# Patient Record
Sex: Male | Born: 2000
Health system: Southern US, Community
[De-identification: ages and names within clinical notes are randomized; demographics above are authoritative.]

## PROBLEM LIST (undated history)

## (undated) ENCOUNTER — Ambulatory Visit (HOSPITAL_COMMUNITY): Admission: EM | Payer: 59 | Source: Home / Self Care

---
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
## 2020-01-02 DIAGNOSIS — F419 Anxiety disorder, unspecified: Secondary | ICD-10-CM | POA: Diagnosis not present

## 2020-01-02 DIAGNOSIS — K589 Irritable bowel syndrome without diarrhea: Secondary | ICD-10-CM | POA: Diagnosis not present

## 2020-01-02 DIAGNOSIS — Z2820 Immunization not carried out because of patient decision for unspecified reason: Secondary | ICD-10-CM | POA: Diagnosis not present

## 2020-01-18 DIAGNOSIS — Z03818 Encounter for observation for suspected exposure to other biological agents ruled out: Secondary | ICD-10-CM | POA: Diagnosis not present

## 2020-01-18 DIAGNOSIS — Z20822 Contact with and (suspected) exposure to covid-19: Secondary | ICD-10-CM | POA: Diagnosis not present

## 2020-02-27 DIAGNOSIS — Z113 Encounter for screening for infections with a predominantly sexual mode of transmission: Secondary | ICD-10-CM | POA: Diagnosis not present

## 2020-04-26 ENCOUNTER — Other Ambulatory Visit (HOSPITAL_COMMUNITY): Payer: Self-pay

## 2020-05-09 ENCOUNTER — Other Ambulatory Visit (HOSPITAL_COMMUNITY): Payer: Self-pay

## 2020-05-09 DIAGNOSIS — L551 Sunburn of second degree: Secondary | ICD-10-CM | POA: Diagnosis not present

## 2020-05-09 MED ORDER — NAPROXEN 500 MG PO TABS
ORAL_TABLET | ORAL | 0 refills | Status: DC
  Filled 2020-05-09: qty 10, 5d supply, fill #0

## 2020-05-09 MED ORDER — TRAMADOL HCL 50 MG PO TABS
ORAL_TABLET | ORAL | 0 refills | Status: DC
  Filled 2020-05-09: qty 10, 2d supply, fill #0

## 2020-05-23 ENCOUNTER — Other Ambulatory Visit (HOSPITAL_COMMUNITY): Payer: Self-pay

## 2020-05-23 MED ORDER — BUSPIRONE HCL 5 MG PO TABS
5.0000 mg | ORAL_TABLET | Freq: Three times a day (TID) | ORAL | 1 refills | Status: DC
  Filled 2020-05-23: qty 90, 30d supply, fill #0
  Filled 2020-07-09: qty 90, 30d supply, fill #1

## 2020-05-24 ENCOUNTER — Other Ambulatory Visit (HOSPITAL_COMMUNITY): Payer: Self-pay

## 2020-06-12 IMAGING — CR DG ABDOMEN 2V
2 series · 2 of 2 positions shown · non-contrast
Comparison: Radiograph April 23, 2012.

CLINICAL DATA: Constipation.

EXAM:
ABDOMEN - 2 VIEW

[w abdomen upright]
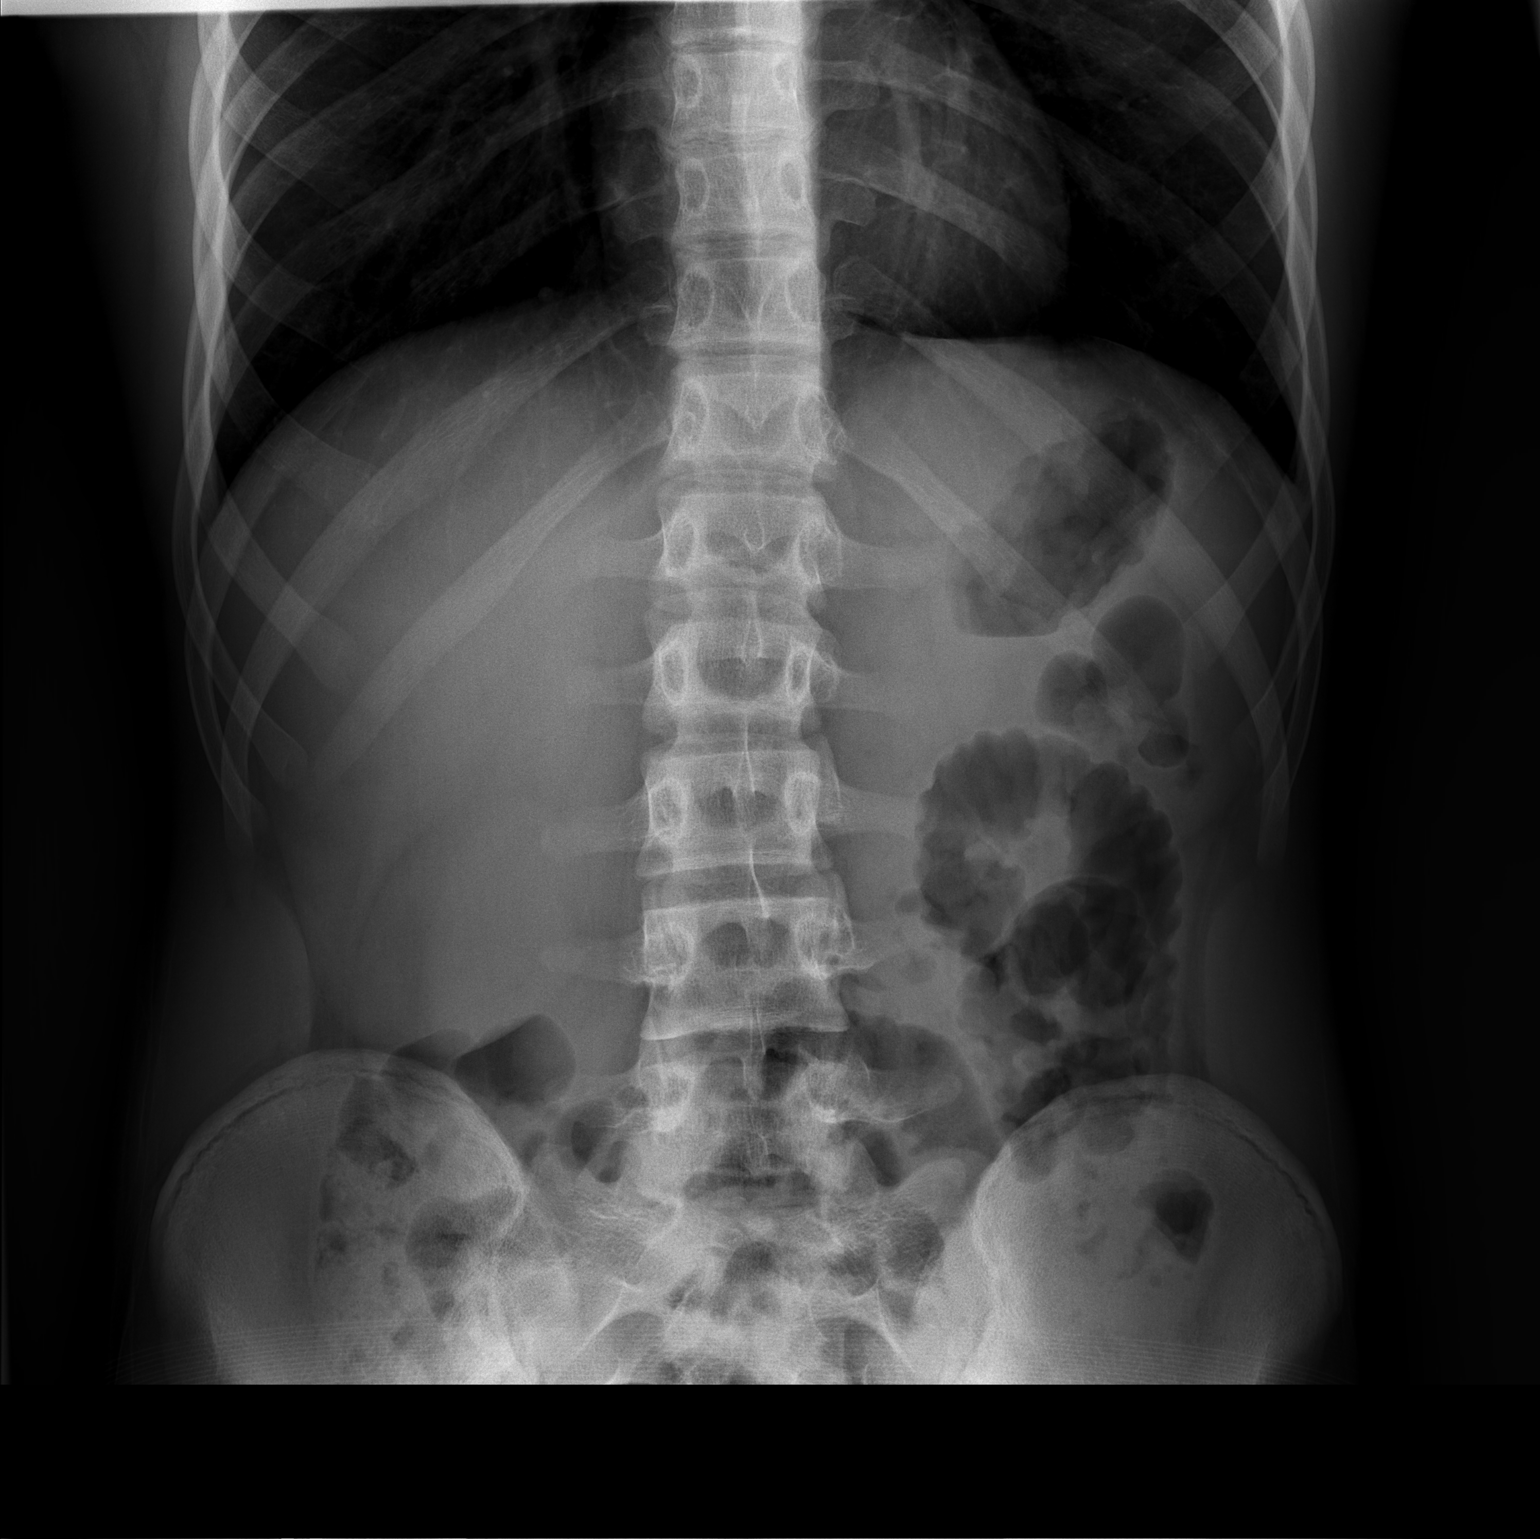

[t abdomen supine]
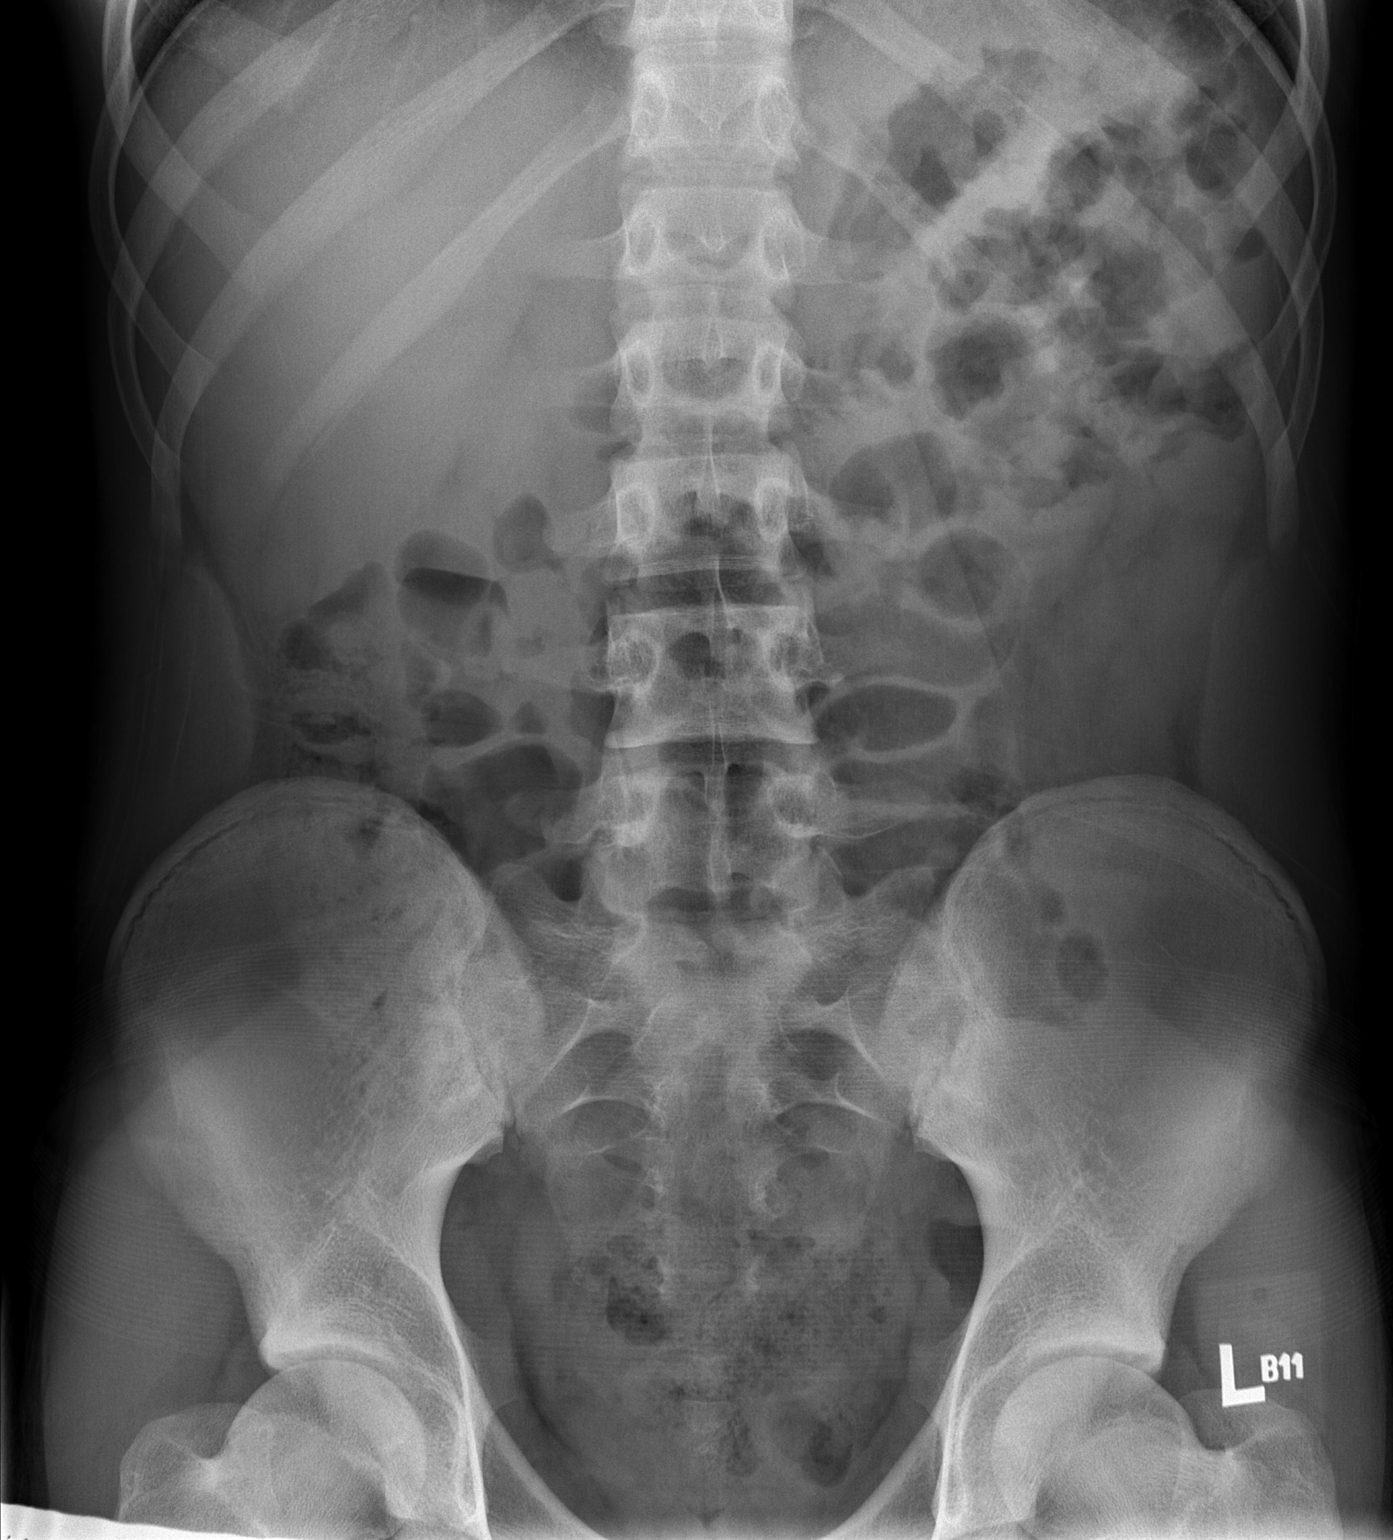

[2 of 2 positions shown; findings below may reference images not displayed]

FINDINGS: The bowel gas pattern is normal. Mild amount of stool is seen
throughout the colon and rectum. There is no evidence of free air.
No radio-opaque calculi or other significant radiographic
abnormality is seen.
IMPRESSION: Mild stool burden is noted. No evidence of bowel obstruction or
ileus.

## 2020-07-09 ENCOUNTER — Other Ambulatory Visit (HOSPITAL_COMMUNITY): Payer: Self-pay

## 2020-07-12 DIAGNOSIS — Z113 Encounter for screening for infections with a predominantly sexual mode of transmission: Secondary | ICD-10-CM | POA: Diagnosis not present

## 2020-08-29 ENCOUNTER — Other Ambulatory Visit (HOSPITAL_COMMUNITY): Payer: Self-pay

## 2020-08-29 MED ORDER — BUSPIRONE HCL 5 MG PO TABS
5.0000 mg | ORAL_TABLET | Freq: Three times a day (TID) | ORAL | 1 refills | Status: DC
  Filled 2020-08-29: qty 90, 30d supply, fill #0

## 2020-11-10 DIAGNOSIS — B084 Enteroviral vesicular stomatitis with exanthem: Secondary | ICD-10-CM | POA: Diagnosis not present

## 2020-11-10 DIAGNOSIS — Z6821 Body mass index (BMI) 21.0-21.9, adult: Secondary | ICD-10-CM | POA: Diagnosis not present

## 2020-11-10 DIAGNOSIS — Z87891 Personal history of nicotine dependence: Secondary | ICD-10-CM | POA: Diagnosis not present

## 2020-11-19 DIAGNOSIS — K219 Gastro-esophageal reflux disease without esophagitis: Secondary | ICD-10-CM | POA: Diagnosis not present

## 2020-12-03 DIAGNOSIS — F411 Generalized anxiety disorder: Secondary | ICD-10-CM | POA: Diagnosis not present

## 2020-12-18 DIAGNOSIS — F411 Generalized anxiety disorder: Secondary | ICD-10-CM | POA: Diagnosis not present

## 2020-12-19 ENCOUNTER — Other Ambulatory Visit (HOSPITAL_COMMUNITY): Payer: Self-pay

## 2020-12-19 MED ORDER — FAMOTIDINE 40 MG PO TABS
40.0000 mg | ORAL_TABLET | Freq: Every evening | ORAL | 5 refills | Status: AC
  Filled 2020-12-19: qty 30, 30d supply, fill #0
  Filled 2021-02-04: qty 30, 30d supply, fill #1

## 2020-12-19 MED ORDER — OMEPRAZOLE 40 MG PO CPDR
40.0000 mg | DELAYED_RELEASE_CAPSULE | Freq: Two times a day (BID) | ORAL | 5 refills | Status: AC
  Filled 2020-12-19: qty 60, 30d supply, fill #0
  Filled 2021-02-04: qty 60, 30d supply, fill #1

## 2021-01-01 ENCOUNTER — Other Ambulatory Visit (HOSPITAL_COMMUNITY): Payer: Self-pay

## 2021-01-01 DIAGNOSIS — F411 Generalized anxiety disorder: Secondary | ICD-10-CM | POA: Diagnosis not present

## 2021-01-01 MED ORDER — PROPRANOLOL HCL 10 MG PO TABS
10.0000 mg | ORAL_TABLET | Freq: Three times a day (TID) | ORAL | 0 refills | Status: AC | PRN
  Filled 2021-01-01 – 2021-01-08 (×2): qty 60, 20d supply, fill #0

## 2021-01-01 MED ORDER — ESCITALOPRAM OXALATE 10 MG PO TABS
10.0000 mg | ORAL_TABLET | Freq: Every day | ORAL | 1 refills | Status: AC
  Filled 2021-01-01 – 2021-01-08 (×2): qty 30, 30d supply, fill #0
  Filled 2021-11-28: qty 30, 30d supply, fill #1

## 2021-01-08 ENCOUNTER — Other Ambulatory Visit: Payer: Self-pay

## 2021-01-08 ENCOUNTER — Other Ambulatory Visit (HOSPITAL_COMMUNITY): Payer: Self-pay

## 2021-01-09 ENCOUNTER — Ambulatory Visit
Admission: EM | Admit: 2021-01-09 | Discharge: 2021-01-09 | Disposition: A | Discharge status: Home / Self Care | Payer: 59 | Attending: Emergency Medicine | Admitting: Emergency Medicine

## 2021-01-09 ENCOUNTER — Other Ambulatory Visit (HOSPITAL_COMMUNITY): Payer: Self-pay

## 2021-01-09 VITALS — BP 143/88 | HR 86 | Temp 98.2°F | Resp 18

## 2021-01-09 DIAGNOSIS — K120 Recurrent oral aphthae: Secondary | ICD-10-CM

## 2021-01-09 MED ORDER — NYSTATIN 100000 UNIT/ML MT SUSP
10.0000 mL | OROMUCOSAL | 1 refills | Status: AC
  Filled 2021-01-09: qty 360, 6d supply, fill #0

## 2021-01-09 NOTE — Discharge Instructions (Addendum)
Please see the enclosed instructions for management and prevention of canker sores.  I provided you with a "Magic mouthwash" that contains several ingredients that should provide you with significant pain relief.  You can swish and spit 15 mL every 3 hours while you are awake.  Thank you for visiting urgent care today, I hope you feel better soon.  If you have not had relief of your symptoms in the next 3 to 5 days, please either follow-up with your primary care provider or with urgent care for further evaluation and treatment.

## 2021-01-09 NOTE — ED Provider Notes (Signed)
[facility]    CSN: [id number] Arrival date & time: 01/09/21  1031    HISTORY  No chief complaint on file.  HPI Mitchell Hunt is a 20 y.o. male. Patient complains of sores in his mouth, inside his lip, under his tongue and on his tongue that appeared about 5 days ago.  Patient denies similar lesions in the past.  Patient denies lesions in his genital area.  Patient denies known exposure to sexually transmitted disease.  Patient denies sick contacts.  Patient states has not tried anything to remedy the sores.  Patient states the sores are very painful, states it hurts to eat or drink anything.  Patient states that this time, the sores have not improved but does not feel they are getting worse.  The history is provided by the patient.  History reviewed. No pertinent past medical history. Patient Active Problem List   Diagnosis Date Noted   Right knee pain 12/27/2015   Traumatic rupture of left plantaris muscle 12/27/2015   History reviewed. No pertinent surgical history.  Home Medications    Prior to Admission medications   Medication Sig Start Date End Date Taking? Authorizing Provider  magic mouthwash (nystatin, lidocaine, diphenhydrAMINE, alum & mag hydroxide) suspension Swish and spit 10 mLs every 3 (three) hours while awake. 01/09/21  Yes Theadora Rama Scales, PA-C  escitalopram (LEXAPRO) 10 MG tablet Take 1 tablet by mouth daily. 01/01/21     famotidine (PEPCID) 40 MG tablet Take 1 tablet by mouth at bedtime. 11/19/20     omeprazole (PRILOSEC) 40 MG capsule Take 1 capsule by mouth 2 times daily. 11/19/20     propranolol (INDERAL) 10 MG tablet Take 1 tablet  by mouth up to 3 times daily as needed for anxiety 01/01/21       Family History Family History  Problem Relation Age of Onset   Hypertension Father    Heart failure Other    Cancer Other        lung   Social History Social History   Tobacco Use   Smoking status: Never   Smokeless tobacco: Never   Substance Use Topics   Alcohol use: No   Drug use: No   Allergies   Patient has no known allergies.  Review of Systems Review of Systems Pertinent findings noted in history of present illness.   Physical Exam Triage Vital Signs ED Triage Vitals  Enc Vitals Group     BP 11/09/20 0827 (!) 147/82     Pulse Rate 11/09/20 0827 72     Resp 11/09/20 0827 18     Temp 11/09/20 0827 98.3 F (36.8 C)     Temp Source 11/09/20 0827 Oral     SpO2 11/09/20 0827 98 %     Weight --      Height --      Head Circumference --      Peak Flow --      Pain Score 11/09/20 0826 5     Pain Loc --      Pain Edu? --      Excl. in GC? --   No data found.  Updated Vital Signs BP (!) 143/88 (BP Location: Right Arm)    Pulse 86    Temp 98.2 F (36.8 C) (Oral)    Resp 18    SpO2 97%   Physical Exam Vitals and nursing note reviewed.  Constitutional:      General: He is not in acute distress.  Appearance: Normal appearance. He is not ill-appearing.  HENT:     Head: Normocephalic and atraumatic.     Salivary Glands: Right salivary gland is not diffusely enlarged or tender. Left salivary gland is not diffusely enlarged or tender.     Right Ear: Tympanic membrane, ear canal and external ear normal. No drainage. No middle ear effusion. There is no impacted cerumen. Tympanic membrane is not erythematous or bulging.     Left Ear: Tympanic membrane, ear canal and external ear normal. No drainage.  No middle ear effusion. There is no impacted cerumen. Tympanic membrane is not erythematous or bulging.     Nose: Nose normal. No nasal deformity, septal deviation, mucosal edema, congestion or rhinorrhea.     Right Turbinates: Not enlarged, swollen or pale.     Left Turbinates: Not enlarged, swollen or pale.     Right Sinus: No maxillary sinus tenderness or frontal sinus tenderness.     Left Sinus: No maxillary sinus tenderness or frontal sinus tenderness.     Mouth/Throat:     Lips: Pink. No lesions.      Mouth: Mucous membranes are moist. No oral lesions.     Dentition: Gingival swelling and gum lesions present.     Tongue: Lesions present.     Pharynx: Oropharynx is clear. Uvula midline. No posterior oropharyngeal erythema or uvula swelling.     Tonsils: No tonsillar exudate. 0 on the right. 0 on the left.     Comments: Aphthous ulcers present on the inner right lip, beneath tongue and on the right side of his tongue Eyes:     General: Lids are normal.        Right eye: No discharge.        Left eye: No discharge.     Extraocular Movements: Extraocular movements intact.     Conjunctiva/sclera: Conjunctivae normal.     Right eye: Right conjunctiva is not injected.     Left eye: Left conjunctiva is not injected.  Neck:     Trachea: Trachea and phonation normal.  Cardiovascular:     Rate and Rhythm: Normal rate and regular rhythm.     Pulses: Normal pulses.     Heart sounds: Normal heart sounds. No murmur heard.   No friction rub. No gallop.  Pulmonary:     Effort: Pulmonary effort is normal. No accessory muscle usage, prolonged expiration or respiratory distress.     Breath sounds: Normal breath sounds. No stridor, decreased air movement or transmitted upper airway sounds. No decreased breath sounds, wheezing, rhonchi or rales.  Chest:     Chest wall: No tenderness.  Musculoskeletal:        General: Normal range of motion.     Cervical back: Normal range of motion and neck supple. Normal range of motion.  Lymphadenopathy:     Cervical: No cervical adenopathy.  Skin:    General: Skin is warm and dry.     Findings: No erythema or rash.  Neurological:     General: No focal deficit present.     Mental Status: He is alert and oriented to person, place, and time.  Psychiatric:        Mood and Affect: Mood normal.        Behavior: Behavior normal.    Visual Acuity Right Eye Distance:   Left Eye Distance:   Bilateral Distance:    Right Eye Near:   Left Eye Near:    Bilateral  Near:  [facility] Couse / Diagnostics / Procedures:    EKG  Radiology No results found.  Procedures Procedures (including critical care time)  UC Diagnoses / Final Clinical Impressions(s)   I have reviewed the triage vital signs and the nursing notes.  Pertinent labs & imaging results that were available during my care of the patient were reviewed by me and considered in my medical decision making (see chart for details).    Final diagnoses:  Aphthous stomatitis   Magic mouthwash, general care measures and education provided for patient.  Return precautions advised.  ED Prescriptions     Medication Sig Dispense Auth. Provider   magic mouthwash (nystatin, lidocaine, diphenhydrAMINE, alum & mag hydroxide) suspension Swish and spit 10 mLs every 3 (three) hours while awake. 360 mL Theadora Rama Scales, PA-C      PDMP not reviewed this encounter.  Pending results:  Labs Reviewed - No data to display  Medications Ordered in UC: Medications - No data to display  Disposition Upon Discharge:  Condition: stable for discharge home Home: take medications as prescribed; routine discharge instructions as discussed; follow up as advised.  Patient presented with an acute illness with associated systemic symptoms and significant discomfort requiring urgent management. In my opinion, this is a condition that a prudent lay person (someone who possesses an average knowledge of health and medicine) may potentially expect to result in complications if not addressed urgently such as respiratory distress, impairment of bodily function or dysfunction of bodily organs.   Routine symptom specific, illness specific and/or disease specific instructions were discussed with the patient and/or caregiver at length.   As such, the patient has been evaluated and assessed, work-up was performed and treatment was provided in alignment with urgent care protocols and evidence based medicine.   Patient/parent/caregiver has been advised that the patient may require follow up for further testing and treatment if the symptoms continue in spite of treatment, as clinically indicated and appropriate.  If the patient was tested for COVID-19, Influenza and/or RSV, then the patient/parent/guardian was advised to isolate at home pending the results of his/her diagnostic coronavirus test and potentially longer if theyre positive. I have also advised pt that if his/her COVID-19 test returns positive, it's recommended to self-isolate for at least 10 days after symptoms first appeared AND until fever-free for 24 hours without fever reducer AND other symptoms have improved or resolved. Discussed self-isolation recommendations as well as instructions for household member/close contacts as per the [facility] and [place] DHHS, and also gave patient the COVID packet with this information.  Patient/parent/caregiver has been advised to return to the [facility] or PCP in 3-5 days if no better; to PCP or the Emergency Department if new signs and symptoms develop, or if the current signs or symptoms continue to change or worsen for further workup, evaluation and treatment as clinically indicated and appropriate  The patient will follow up with their current PCP if and as advised. If the patient does not currently have a PCP we will assist them in obtaining one.   The patient may need specialty follow up if the symptoms continue, in spite of conservative treatment and management, for further workup, evaluation, consultation and treatment as clinically indicated and appropriate.   Patient/parent/caregiver verbalized understanding and agreement of plan as discussed.  All questions were addressed during visit.  Please see discharge instructions below for further details of plan.  Discharge Instructions:   Discharge Instructions      Please see the enclosed instructions for  management and prevention of canker sores.  I provided you  with a "Magic mouthwash" that contains several ingredients that should provide you with significant pain relief.  You can swish and spit 15 mL every 3 hours while you are awake.  Thank you for visiting urgent care today, I hope you feel better soon.  If you have not had relief of your symptoms in the next 3 to 5 days, please either follow-up with your primary care provider or with urgent care for further evaluation and treatment.    This office note has been dictated using Teaching laboratory technician.  Unfortunately, and despite my best efforts, this method of dictation can sometimes lead to occasional typographical or grammatical errors.  I apologize in advance if this occurs.     Theadora Rama Scales, PA-C 01/09/21 1102

## 2021-01-09 NOTE — ED Triage Notes (Signed)
Pt c/o sores to his tongue, lip and throat that began 5 days ago.

## 2021-01-10 ENCOUNTER — Other Ambulatory Visit (HOSPITAL_COMMUNITY): Payer: Self-pay

## 2021-01-11 ENCOUNTER — Other Ambulatory Visit (HOSPITAL_COMMUNITY): Payer: Self-pay

## 2021-01-31 ENCOUNTER — Other Ambulatory Visit (HOSPITAL_COMMUNITY): Payer: Self-pay

## 2021-01-31 DIAGNOSIS — F411 Generalized anxiety disorder: Secondary | ICD-10-CM | POA: Diagnosis not present

## 2021-01-31 MED ORDER — PROPRANOLOL HCL 10 MG PO TABS
ORAL_TABLET | ORAL | 0 refills | Status: DC
  Filled 2021-01-31: qty 60, 20d supply, fill #0

## 2021-01-31 MED ORDER — ESCITALOPRAM OXALATE 10 MG PO TABS
15.0000 mg | ORAL_TABLET | Freq: Every day | ORAL | 0 refills | Status: AC
  Filled 2021-01-31: qty 45, 30d supply, fill #0

## 2021-02-01 ENCOUNTER — Other Ambulatory Visit (HOSPITAL_COMMUNITY): Payer: Self-pay

## 2021-02-04 ENCOUNTER — Other Ambulatory Visit (HOSPITAL_COMMUNITY): Payer: Self-pay

## 2021-02-14 ENCOUNTER — Other Ambulatory Visit (HOSPITAL_COMMUNITY): Payer: Self-pay

## 2021-02-14 DIAGNOSIS — F411 Generalized anxiety disorder: Secondary | ICD-10-CM | POA: Diagnosis not present

## 2021-02-14 MED ORDER — SERTRALINE HCL 25 MG PO TABS
ORAL_TABLET | ORAL | 1 refills | Status: AC
  Filled 2021-02-14: qty 45, 30d supply, fill #0

## 2021-03-04 DIAGNOSIS — R519 Headache, unspecified: Secondary | ICD-10-CM | POA: Diagnosis not present

## 2021-03-04 DIAGNOSIS — S060X0A Concussion without loss of consciousness, initial encounter: Secondary | ICD-10-CM | POA: Diagnosis not present

## 2021-03-04 DIAGNOSIS — S01511A Laceration without foreign body of lip, initial encounter: Secondary | ICD-10-CM | POA: Diagnosis not present

## 2021-03-04 DIAGNOSIS — K120 Recurrent oral aphthae: Secondary | ICD-10-CM | POA: Diagnosis not present

## 2021-03-11 ENCOUNTER — Other Ambulatory Visit (HOSPITAL_COMMUNITY): Payer: Self-pay

## 2021-03-11 DIAGNOSIS — K219 Gastro-esophageal reflux disease without esophagitis: Secondary | ICD-10-CM | POA: Diagnosis not present

## 2021-03-11 MED ORDER — SERTRALINE HCL 50 MG PO TABS
50.0000 mg | ORAL_TABLET | Freq: Every day | ORAL | 0 refills | Status: DC
  Filled 2021-03-11: qty 30, 30d supply, fill #0

## 2021-03-11 MED ORDER — PROPRANOLOL HCL 10 MG PO TABS
ORAL_TABLET | ORAL | 0 refills | Status: DC
  Filled 2021-03-11: qty 60, 20d supply, fill #0

## 2021-03-14 DIAGNOSIS — F411 Generalized anxiety disorder: Secondary | ICD-10-CM | POA: Diagnosis not present

## 2021-04-08 DIAGNOSIS — F411 Generalized anxiety disorder: Secondary | ICD-10-CM | POA: Diagnosis not present

## 2021-04-15 ENCOUNTER — Other Ambulatory Visit (HOSPITAL_COMMUNITY): Payer: Self-pay

## 2021-04-15 MED ORDER — SERTRALINE HCL 50 MG PO TABS
ORAL_TABLET | ORAL | 2 refills | Status: AC
  Filled 2021-04-15: qty 30, 30d supply, fill #0

## 2021-04-15 MED ORDER — PROPRANOLOL HCL 10 MG PO TABS
ORAL_TABLET | ORAL | 2 refills | Status: AC
  Filled 2021-04-15: qty 60, 30d supply, fill #0
  Filled 2021-06-05: qty 60, 30d supply, fill #1

## 2021-04-16 ENCOUNTER — Other Ambulatory Visit (HOSPITAL_COMMUNITY): Payer: Self-pay

## 2021-04-18 ENCOUNTER — Other Ambulatory Visit (HOSPITAL_COMMUNITY): Payer: Self-pay

## 2021-04-18 DIAGNOSIS — F411 Generalized anxiety disorder: Secondary | ICD-10-CM | POA: Diagnosis not present

## 2021-04-18 MED ORDER — SERTRALINE HCL 100 MG PO TABS
100.0000 mg | ORAL_TABLET | Freq: Every day | ORAL | 0 refills | Status: DC
  Filled 2021-04-18: qty 30, 30d supply, fill #0

## 2021-05-10 DIAGNOSIS — F411 Generalized anxiety disorder: Secondary | ICD-10-CM | POA: Diagnosis not present

## 2021-05-17 ENCOUNTER — Other Ambulatory Visit (HOSPITAL_COMMUNITY): Payer: Self-pay

## 2021-05-17 DIAGNOSIS — F411 Generalized anxiety disorder: Secondary | ICD-10-CM | POA: Diagnosis not present

## 2021-05-17 MED ORDER — SERTRALINE HCL 100 MG PO TABS
100.0000 mg | ORAL_TABLET | Freq: Every day | ORAL | 0 refills | Status: AC
  Filled 2021-05-17: qty 30, 30d supply, fill #0

## 2021-06-05 ENCOUNTER — Other Ambulatory Visit (HOSPITAL_COMMUNITY): Payer: Self-pay

## 2021-06-07 ENCOUNTER — Other Ambulatory Visit (HOSPITAL_COMMUNITY): Payer: Self-pay

## 2021-06-07 DIAGNOSIS — F411 Generalized anxiety disorder: Secondary | ICD-10-CM | POA: Diagnosis not present

## 2021-06-07 MED ORDER — FLUVOXAMINE MALEATE 50 MG PO TABS
ORAL_TABLET | ORAL | 0 refills | Status: AC
  Filled 2021-06-07: qty 45, 30d supply, fill #0

## 2021-06-07 MED ORDER — SERTRALINE HCL 50 MG PO TABS
ORAL_TABLET | ORAL | 0 refills | Status: AC
  Filled 2021-06-07: qty 7, 7d supply, fill #0

## 2021-07-03 ENCOUNTER — Other Ambulatory Visit (HOSPITAL_COMMUNITY): Payer: Self-pay

## 2021-07-03 MED ORDER — PROPRANOLOL HCL 10 MG PO TABS
ORAL_TABLET | ORAL | 2 refills | Status: AC
  Filled 2021-07-03: qty 60, 20d supply, fill #0
  Filled 2021-08-26: qty 60, 20d supply, fill #1

## 2021-07-03 MED ORDER — FLUVOXAMINE MALEATE 100 MG PO TABS
ORAL_TABLET | ORAL | 1 refills | Status: AC
  Filled 2021-07-03: qty 30, 30d supply, fill #0

## 2021-07-26 ENCOUNTER — Other Ambulatory Visit (HOSPITAL_COMMUNITY): Payer: Self-pay

## 2021-07-26 DIAGNOSIS — F411 Generalized anxiety disorder: Secondary | ICD-10-CM | POA: Diagnosis not present

## 2021-07-26 MED ORDER — ESCITALOPRAM OXALATE 10 MG PO TABS
10.0000 mg | ORAL_TABLET | Freq: Every day | ORAL | 0 refills | Status: AC
  Filled 2021-07-26: qty 30, 30d supply, fill #0

## 2021-08-01 DIAGNOSIS — H5213 Myopia, bilateral: Secondary | ICD-10-CM | POA: Diagnosis not present

## 2021-08-23 ENCOUNTER — Other Ambulatory Visit (HOSPITAL_COMMUNITY): Payer: Self-pay

## 2021-08-23 DIAGNOSIS — F411 Generalized anxiety disorder: Secondary | ICD-10-CM | POA: Diagnosis not present

## 2021-08-23 MED ORDER — ESCITALOPRAM OXALATE 20 MG PO TABS
20.0000 mg | ORAL_TABLET | Freq: Every day | ORAL | 0 refills | Status: DC
  Filled 2021-08-23: qty 30, 30d supply, fill #0

## 2021-08-26 ENCOUNTER — Other Ambulatory Visit (HOSPITAL_COMMUNITY): Payer: Self-pay

## 2021-09-20 ENCOUNTER — Other Ambulatory Visit (HOSPITAL_COMMUNITY): Payer: Self-pay

## 2021-09-20 DIAGNOSIS — F411 Generalized anxiety disorder: Secondary | ICD-10-CM | POA: Diagnosis not present

## 2021-09-20 MED ORDER — PROPRANOLOL HCL 10 MG PO TABS
ORAL_TABLET | ORAL | 2 refills | Status: AC
  Filled 2021-09-20: qty 60, 20d supply, fill #0

## 2021-09-20 MED ORDER — ESCITALOPRAM OXALATE 20 MG PO TABS
20.0000 mg | ORAL_TABLET | Freq: Every day | ORAL | 0 refills | Status: DC
  Filled 2021-09-20: qty 30, 30d supply, fill #0

## 2021-10-22 ENCOUNTER — Other Ambulatory Visit (HOSPITAL_COMMUNITY): Payer: Self-pay

## 2021-10-22 DIAGNOSIS — F411 Generalized anxiety disorder: Secondary | ICD-10-CM | POA: Diagnosis not present

## 2021-10-22 MED ORDER — ESCITALOPRAM OXALATE 10 MG PO TABS
10.0000 mg | ORAL_TABLET | Freq: Every day | ORAL | 0 refills | Status: AC
  Filled 2021-10-22: qty 30, 30d supply, fill #0

## 2021-10-22 MED ORDER — AMITRIPTYLINE HCL 25 MG PO TABS
25.0000 mg | ORAL_TABLET | Freq: Every day | ORAL | 0 refills | Status: DC
  Filled 2021-10-22: qty 30, 30d supply, fill #0

## 2021-11-12 ENCOUNTER — Other Ambulatory Visit (HOSPITAL_COMMUNITY): Payer: Self-pay

## 2021-11-12 DIAGNOSIS — F408 Other phobic anxiety disorders: Secondary | ICD-10-CM | POA: Diagnosis not present

## 2021-11-12 DIAGNOSIS — F450 Somatization disorder: Secondary | ICD-10-CM | POA: Diagnosis not present

## 2021-11-12 DIAGNOSIS — F411 Generalized anxiety disorder: Secondary | ICD-10-CM | POA: Diagnosis not present

## 2021-11-12 MED ORDER — ESCITALOPRAM OXALATE 10 MG PO TABS
10.0000 mg | ORAL_TABLET | Freq: Every day | ORAL | 0 refills | Status: DC
  Filled 2021-11-12 – 2021-11-19 (×2): qty 30, 30d supply, fill #0

## 2021-11-12 MED ORDER — AMITRIPTYLINE HCL 25 MG PO TABS
25.0000 mg | ORAL_TABLET | Freq: Every day | ORAL | 0 refills | Status: DC
  Filled 2021-11-12 – 2021-11-19 (×2): qty 30, 30d supply, fill #0

## 2021-11-19 ENCOUNTER — Other Ambulatory Visit (HOSPITAL_COMMUNITY): Payer: Self-pay

## 2021-11-21 ENCOUNTER — Other Ambulatory Visit (HOSPITAL_COMMUNITY): Payer: Self-pay

## 2021-11-23 ENCOUNTER — Other Ambulatory Visit (HOSPITAL_COMMUNITY): Payer: Self-pay

## 2021-11-25 ENCOUNTER — Other Ambulatory Visit: Payer: Self-pay

## 2021-11-25 ENCOUNTER — Other Ambulatory Visit (HOSPITAL_COMMUNITY): Payer: Self-pay

## 2021-11-26 ENCOUNTER — Other Ambulatory Visit (HOSPITAL_COMMUNITY): Payer: Self-pay

## 2021-11-28 ENCOUNTER — Other Ambulatory Visit (HOSPITAL_COMMUNITY): Payer: Self-pay

## 2021-12-06 ENCOUNTER — Other Ambulatory Visit (HOSPITAL_COMMUNITY): Payer: Self-pay

## 2021-12-13 DIAGNOSIS — F411 Generalized anxiety disorder: Secondary | ICD-10-CM | POA: Diagnosis not present

## 2022-01-14 ENCOUNTER — Other Ambulatory Visit (HOSPITAL_COMMUNITY): Payer: Self-pay

## 2022-01-14 ENCOUNTER — Other Ambulatory Visit: Payer: Self-pay

## 2022-01-14 DIAGNOSIS — F411 Generalized anxiety disorder: Secondary | ICD-10-CM | POA: Diagnosis not present

## 2022-01-14 MED ORDER — AMITRIPTYLINE HCL 25 MG PO TABS
25.0000 mg | ORAL_TABLET | Freq: Every day | ORAL | 1 refills | Status: DC
  Filled 2022-01-14: qty 30, 30d supply, fill #0
  Filled 2022-02-18: qty 30, 30d supply, fill #1

## 2022-01-14 MED ORDER — ESCITALOPRAM OXALATE 10 MG PO TABS
10.0000 mg | ORAL_TABLET | Freq: Every day | ORAL | 1 refills | Status: DC
  Filled 2022-01-14: qty 30, 30d supply, fill #0
  Filled 2022-02-18: qty 30, 30d supply, fill #1

## 2022-01-16 ENCOUNTER — Other Ambulatory Visit (HOSPITAL_COMMUNITY): Payer: Self-pay

## 2022-01-20 ENCOUNTER — Other Ambulatory Visit (HOSPITAL_COMMUNITY): Payer: Self-pay

## 2022-01-21 ENCOUNTER — Other Ambulatory Visit (HOSPITAL_COMMUNITY): Payer: Self-pay

## 2022-02-18 ENCOUNTER — Other Ambulatory Visit (HOSPITAL_COMMUNITY): Payer: Self-pay

## 2022-02-19 ENCOUNTER — Other Ambulatory Visit: Payer: Self-pay

## 2022-02-20 ENCOUNTER — Other Ambulatory Visit (HOSPITAL_COMMUNITY): Payer: Self-pay

## 2022-03-04 ENCOUNTER — Other Ambulatory Visit (HOSPITAL_COMMUNITY): Payer: Self-pay

## 2022-03-04 DIAGNOSIS — F411 Generalized anxiety disorder: Secondary | ICD-10-CM | POA: Diagnosis not present

## 2022-03-04 MED ORDER — ESCITALOPRAM OXALATE 10 MG PO TABS
10.0000 mg | ORAL_TABLET | Freq: Every day | ORAL | 1 refills | Status: DC
  Filled 2022-03-04 – 2022-03-22 (×2): qty 30, 30d supply, fill #0
  Filled 2022-04-21: qty 30, 30d supply, fill #1

## 2022-03-04 MED ORDER — AMITRIPTYLINE HCL 25 MG PO TABS
25.0000 mg | ORAL_TABLET | Freq: Every day | ORAL | 1 refills | Status: DC
  Filled 2022-03-04 – 2022-03-22 (×2): qty 30, 30d supply, fill #0
  Filled 2022-04-21: qty 30, 30d supply, fill #1

## 2022-03-22 ENCOUNTER — Other Ambulatory Visit (HOSPITAL_COMMUNITY): Payer: Self-pay

## 2022-03-25 ENCOUNTER — Other Ambulatory Visit (HOSPITAL_COMMUNITY): Payer: Self-pay

## 2022-03-25 ENCOUNTER — Other Ambulatory Visit: Payer: Self-pay

## 2022-03-26 ENCOUNTER — Other Ambulatory Visit (HOSPITAL_COMMUNITY): Payer: Self-pay

## 2022-04-21 ENCOUNTER — Other Ambulatory Visit (HOSPITAL_COMMUNITY): Payer: Self-pay

## 2022-04-29 ENCOUNTER — Other Ambulatory Visit (HOSPITAL_COMMUNITY): Payer: Self-pay

## 2022-04-29 DIAGNOSIS — F411 Generalized anxiety disorder: Secondary | ICD-10-CM | POA: Diagnosis not present

## 2022-04-29 MED ORDER — ESCITALOPRAM OXALATE 10 MG PO TABS
10.0000 mg | ORAL_TABLET | Freq: Every day | ORAL | 2 refills | Status: AC
  Filled 2022-05-14: qty 30, 30d supply, fill #0
  Filled 2022-06-19 (×2): qty 30, 30d supply, fill #1

## 2022-04-29 MED ORDER — AMITRIPTYLINE HCL 25 MG PO TABS
25.0000 mg | ORAL_TABLET | Freq: Every day | ORAL | 2 refills | Status: DC
  Filled 2022-05-14: qty 30, 30d supply, fill #0
  Filled 2022-06-19 (×2): qty 30, 30d supply, fill #1
  Filled 2022-07-22: qty 30, 30d supply, fill #2

## 2022-05-14 ENCOUNTER — Other Ambulatory Visit (HOSPITAL_COMMUNITY): Payer: Self-pay

## 2022-05-14 DIAGNOSIS — Z711 Person with feared health complaint in whom no diagnosis is made: Secondary | ICD-10-CM | POA: Diagnosis not present

## 2022-05-15 ENCOUNTER — Other Ambulatory Visit: Payer: Self-pay

## 2022-06-19 ENCOUNTER — Other Ambulatory Visit (HOSPITAL_COMMUNITY): Payer: Self-pay

## 2022-06-20 ENCOUNTER — Other Ambulatory Visit (HOSPITAL_COMMUNITY): Payer: Self-pay

## 2022-06-20 ENCOUNTER — Other Ambulatory Visit: Payer: Self-pay

## 2022-06-30 DIAGNOSIS — F411 Generalized anxiety disorder: Secondary | ICD-10-CM | POA: Diagnosis not present

## 2022-07-01 ENCOUNTER — Other Ambulatory Visit (HOSPITAL_COMMUNITY): Payer: Self-pay

## 2022-07-01 MED ORDER — ESCITALOPRAM OXALATE 10 MG PO TABS
10.0000 mg | ORAL_TABLET | Freq: Every day | ORAL | 2 refills | Status: AC
  Filled 2022-07-22: qty 30, 30d supply, fill #0

## 2022-07-01 MED ORDER — AMITRIPTYLINE HCL 25 MG PO TABS: 25.0000 mg | ORAL_TABLET | Freq: Every day | ORAL | 2 refills | Status: AC

## 2022-07-22 ENCOUNTER — Other Ambulatory Visit (HOSPITAL_COMMUNITY): Payer: Self-pay

## 2022-08-12 DIAGNOSIS — F411 Generalized anxiety disorder: Secondary | ICD-10-CM | POA: Diagnosis not present

## 2022-08-22 ENCOUNTER — Other Ambulatory Visit (HOSPITAL_COMMUNITY): Payer: Self-pay

## 2022-08-22 ENCOUNTER — Other Ambulatory Visit: Payer: Self-pay

## 2022-09-22 DIAGNOSIS — F408 Other phobic anxiety disorders: Secondary | ICD-10-CM | POA: Diagnosis not present

## 2022-09-23 ENCOUNTER — Other Ambulatory Visit (HOSPITAL_COMMUNITY): Payer: Self-pay

## 2022-09-23 MED ORDER — AMITRIPTYLINE HCL 25 MG PO TABS
25.0000 mg | ORAL_TABLET | Freq: Every day | ORAL | 2 refills | Status: AC
  Filled 2022-09-23: qty 30, 30d supply, fill #0
  Filled 2022-10-23: qty 30, 30d supply, fill #1
  Filled 2022-11-19: qty 30, 30d supply, fill #2

## 2022-09-23 MED ORDER — ESCITALOPRAM OXALATE 10 MG PO TABS
10.0000 mg | ORAL_TABLET | Freq: Every day | ORAL | 2 refills | Status: AC
  Filled 2022-09-23: qty 30, 30d supply, fill #0
  Filled 2022-10-23: qty 30, 30d supply, fill #1
  Filled 2022-11-21: qty 30, 30d supply, fill #2

## 2022-10-23 ENCOUNTER — Other Ambulatory Visit (HOSPITAL_COMMUNITY): Payer: Self-pay

## 2022-10-23 ENCOUNTER — Other Ambulatory Visit: Payer: Self-pay

## 2022-11-10 DIAGNOSIS — F411 Generalized anxiety disorder: Secondary | ICD-10-CM | POA: Diagnosis not present

## 2022-11-19 ENCOUNTER — Other Ambulatory Visit (HOSPITAL_COMMUNITY): Payer: Self-pay

## 2022-11-19 ENCOUNTER — Other Ambulatory Visit: Payer: Self-pay

## 2022-11-21 ENCOUNTER — Other Ambulatory Visit (HOSPITAL_COMMUNITY): Payer: Self-pay

## 2022-11-21 MED ORDER — ESCITALOPRAM OXALATE 20 MG PO TABS
20.0000 mg | ORAL_TABLET | Freq: Every day | ORAL | 1 refills | Status: DC
  Filled 2022-11-21: qty 30, 30d supply, fill #0
  Filled 2022-12-17: qty 30, 30d supply, fill #1

## 2022-11-24 ENCOUNTER — Other Ambulatory Visit: Payer: Self-pay

## 2022-11-27 ENCOUNTER — Other Ambulatory Visit: Payer: Self-pay

## 2022-12-01 DIAGNOSIS — F411 Generalized anxiety disorder: Secondary | ICD-10-CM | POA: Diagnosis not present

## 2022-12-02 ENCOUNTER — Other Ambulatory Visit (HOSPITAL_COMMUNITY): Payer: Self-pay

## 2022-12-02 MED ORDER — AMITRIPTYLINE HCL 50 MG PO TABS
50.0000 mg | ORAL_TABLET | Freq: Every day | ORAL | 2 refills | Status: AC
  Filled 2022-12-02: qty 30, 30d supply, fill #0
  Filled 2023-01-20: qty 30, 30d supply, fill #1
  Filled 2023-06-12: qty 30, 30d supply, fill #2

## 2022-12-04 DIAGNOSIS — H5213 Myopia, bilateral: Secondary | ICD-10-CM | POA: Diagnosis not present

## 2022-12-17 ENCOUNTER — Other Ambulatory Visit: Payer: Self-pay

## 2022-12-17 ENCOUNTER — Other Ambulatory Visit (HOSPITAL_COMMUNITY): Payer: Self-pay

## 2022-12-22 ENCOUNTER — Other Ambulatory Visit (HOSPITAL_COMMUNITY): Payer: Self-pay

## 2022-12-22 DIAGNOSIS — F411 Generalized anxiety disorder: Secondary | ICD-10-CM | POA: Diagnosis not present

## 2022-12-22 MED ORDER — ESCITALOPRAM OXALATE 20 MG PO TABS
20.0000 mg | ORAL_TABLET | Freq: Every day | ORAL | 1 refills | Status: AC
  Filled 2023-01-20: qty 30, 30d supply, fill #0

## 2022-12-23 ENCOUNTER — Other Ambulatory Visit (HOSPITAL_COMMUNITY): Payer: Self-pay

## 2023-01-20 ENCOUNTER — Other Ambulatory Visit (HOSPITAL_COMMUNITY): Payer: Self-pay

## 2023-01-20 ENCOUNTER — Other Ambulatory Visit: Payer: Self-pay

## 2023-01-26 DIAGNOSIS — F411 Generalized anxiety disorder: Secondary | ICD-10-CM | POA: Diagnosis not present

## 2023-01-27 ENCOUNTER — Other Ambulatory Visit (HOSPITAL_COMMUNITY): Payer: Self-pay

## 2023-01-27 MED ORDER — AMITRIPTYLINE HCL 50 MG PO TABS
50.0000 mg | ORAL_TABLET | Freq: Every day | ORAL | 2 refills | Status: DC
  Filled 2023-01-27 – 2023-02-17 (×2): qty 30, 30d supply, fill #0
  Filled 2023-03-13: qty 30, 30d supply, fill #1

## 2023-01-27 MED ORDER — ESCITALOPRAM OXALATE 20 MG PO TABS
20.0000 mg | ORAL_TABLET | Freq: Every day | ORAL | 1 refills | Status: AC
  Filled 2023-01-27 – 2023-02-17 (×2): qty 30, 30d supply, fill #0
  Filled 2023-03-13: qty 30, 30d supply, fill #1

## 2023-02-17 ENCOUNTER — Other Ambulatory Visit (HOSPITAL_COMMUNITY): Payer: Self-pay

## 2023-02-23 ENCOUNTER — Other Ambulatory Visit (HOSPITAL_COMMUNITY): Payer: Self-pay

## 2023-03-04 ENCOUNTER — Other Ambulatory Visit (HOSPITAL_COMMUNITY): Payer: Self-pay

## 2023-03-13 ENCOUNTER — Other Ambulatory Visit (HOSPITAL_COMMUNITY): Payer: Self-pay

## 2023-03-16 ENCOUNTER — Other Ambulatory Visit (HOSPITAL_COMMUNITY): Payer: Self-pay

## 2023-03-16 MED ORDER — ESCITALOPRAM OXALATE 20 MG PO TABS
20.0000 mg | ORAL_TABLET | Freq: Every day | ORAL | 1 refills | Status: AC
  Filled 2023-04-13: qty 30, 30d supply, fill #0

## 2023-03-16 MED ORDER — ARIPIPRAZOLE 2 MG PO TABS
2.0000 mg | ORAL_TABLET | Freq: Every evening | ORAL | 0 refills | Status: DC
Start: 2023-03-16 — End: 2023-04-13
  Filled 2023-03-16: qty 30, 30d supply, fill #0

## 2023-03-16 MED ORDER — AMITRIPTYLINE HCL 25 MG PO TABS
25.0000 mg | ORAL_TABLET | Freq: Every day | ORAL | 0 refills | Status: DC
  Filled 2023-03-16: qty 30, 30d supply, fill #0

## 2023-03-17 ENCOUNTER — Other Ambulatory Visit: Payer: Self-pay

## 2023-04-13 ENCOUNTER — Other Ambulatory Visit (HOSPITAL_BASED_OUTPATIENT_CLINIC_OR_DEPARTMENT_OTHER): Payer: Self-pay

## 2023-04-13 ENCOUNTER — Other Ambulatory Visit (HOSPITAL_COMMUNITY): Payer: Self-pay

## 2023-04-13 ENCOUNTER — Other Ambulatory Visit: Payer: Self-pay

## 2023-04-13 MED ORDER — ESCITALOPRAM OXALATE 20 MG PO TABS
20.0000 mg | ORAL_TABLET | Freq: Every day | ORAL | 1 refills | Status: AC
  Filled 2023-04-13: qty 30, 30d supply, fill #0

## 2023-04-13 MED ORDER — AMITRIPTYLINE HCL 25 MG PO TABS
25.0000 mg | ORAL_TABLET | Freq: Every day | ORAL | 0 refills | Status: AC
  Filled 2023-04-13: qty 30, 30d supply, fill #0

## 2023-04-13 MED ORDER — ARIPIPRAZOLE 2 MG PO TABS
2.0000 mg | ORAL_TABLET | Freq: Every evening | ORAL | 0 refills | Status: AC
Start: 2023-04-13 — End: ?
  Filled 2023-04-13: qty 30, 30d supply, fill #0

## 2023-04-14 ENCOUNTER — Other Ambulatory Visit (HOSPITAL_COMMUNITY): Payer: Self-pay

## 2023-04-14 ENCOUNTER — Other Ambulatory Visit: Payer: Self-pay

## 2023-04-15 ENCOUNTER — Other Ambulatory Visit (HOSPITAL_COMMUNITY): Payer: Self-pay

## 2023-04-30 ENCOUNTER — Other Ambulatory Visit: Payer: Self-pay

## 2023-04-30 ENCOUNTER — Other Ambulatory Visit (HOSPITAL_COMMUNITY): Payer: Self-pay

## 2023-04-30 MED ORDER — PROPRANOLOL HCL 10 MG PO TABS
10.0000 mg | ORAL_TABLET | ORAL | 0 refills | Status: AC | PRN
  Filled 2023-04-30 – 2023-05-13 (×2): qty 60, 30d supply, fill #0

## 2023-04-30 MED ORDER — AMITRIPTYLINE HCL 50 MG PO TABS
50.0000 mg | ORAL_TABLET | Freq: Every day | ORAL | 0 refills | Status: AC
  Filled 2023-04-30 – 2023-05-14 (×2): qty 30, 30d supply, fill #0

## 2023-04-30 MED ORDER — ESCITALOPRAM OXALATE 20 MG PO TABS
30.0000 mg | ORAL_TABLET | Freq: Every day | ORAL | 1 refills | Status: AC
  Filled 2023-04-30 – 2023-05-13 (×2): qty 45, 30d supply, fill #0
  Filled 2023-07-16: qty 45, 30d supply, fill #1

## 2023-05-01 ENCOUNTER — Other Ambulatory Visit: Payer: Self-pay

## 2023-05-06 ENCOUNTER — Other Ambulatory Visit: Payer: Self-pay

## 2023-05-13 ENCOUNTER — Other Ambulatory Visit: Payer: Self-pay

## 2023-05-13 ENCOUNTER — Other Ambulatory Visit (HOSPITAL_COMMUNITY): Payer: Self-pay

## 2023-05-14 ENCOUNTER — Other Ambulatory Visit (HOSPITAL_COMMUNITY): Payer: Self-pay

## 2023-05-21 ENCOUNTER — Other Ambulatory Visit (HOSPITAL_COMMUNITY): Payer: Self-pay

## 2023-05-21 MED ORDER — ESCITALOPRAM OXALATE 20 MG PO TABS
30.0000 mg | ORAL_TABLET | Freq: Every day | ORAL | 1 refills | Status: AC
  Filled 2023-05-21 – 2023-06-12 (×2): qty 45, 30d supply, fill #0

## 2023-05-21 MED ORDER — AMITRIPTYLINE HCL 50 MG PO TABS
50.0000 mg | ORAL_TABLET | Freq: Every day | ORAL | 0 refills | Status: AC
  Filled 2023-05-21: qty 30, 30d supply, fill #0

## 2023-06-12 ENCOUNTER — Other Ambulatory Visit (HOSPITAL_COMMUNITY): Payer: Self-pay

## 2023-06-15 ENCOUNTER — Other Ambulatory Visit (HOSPITAL_COMMUNITY): Payer: Self-pay

## 2023-06-18 ENCOUNTER — Other Ambulatory Visit (HOSPITAL_COMMUNITY): Payer: Self-pay

## 2023-06-18 MED ORDER — AMITRIPTYLINE HCL 50 MG PO TABS
50.0000 mg | ORAL_TABLET | Freq: Every day | ORAL | 1 refills | Status: AC
  Filled 2023-07-16: qty 30, 30d supply, fill #0

## 2023-06-18 MED ORDER — ESCITALOPRAM OXALATE 20 MG PO TABS: 30.0000 mg | ORAL_TABLET | Freq: Every day | ORAL | 1 refills | Status: AC

## 2023-07-16 ENCOUNTER — Other Ambulatory Visit: Payer: Self-pay

## 2023-07-16 ENCOUNTER — Other Ambulatory Visit (HOSPITAL_COMMUNITY): Payer: Self-pay

## 2023-08-03 ENCOUNTER — Other Ambulatory Visit (HOSPITAL_COMMUNITY): Payer: Self-pay

## 2023-08-03 MED ORDER — AMITRIPTYLINE HCL 50 MG PO TABS
50.0000 mg | ORAL_TABLET | Freq: Every day | ORAL | 1 refills | Status: AC
  Filled 2023-08-17: qty 30, 30d supply, fill #0

## 2023-08-03 MED ORDER — ESCITALOPRAM OXALATE 20 MG PO TABS
30.0000 mg | ORAL_TABLET | Freq: Every day | ORAL | 1 refills | Status: AC
  Filled 2023-08-17: qty 45, 30d supply, fill #0

## 2023-08-14 ENCOUNTER — Other Ambulatory Visit: Payer: Self-pay

## 2023-08-17 ENCOUNTER — Other Ambulatory Visit (HOSPITAL_COMMUNITY): Payer: Self-pay

## 2023-09-04 ENCOUNTER — Other Ambulatory Visit (HOSPITAL_COMMUNITY): Payer: Self-pay

## 2023-09-04 MED ORDER — AMITRIPTYLINE HCL 50 MG PO TABS
50.0000 mg | ORAL_TABLET | Freq: Every day | ORAL | 1 refills | Status: AC
  Filled 2023-09-04 – 2023-09-17 (×2): qty 30, 30d supply, fill #0
  Filled 2023-10-16: qty 30, 30d supply, fill #1

## 2023-09-04 MED ORDER — ESCITALOPRAM OXALATE 20 MG PO TABS
30.0000 mg | ORAL_TABLET | Freq: Every day | ORAL | 1 refills | Status: AC
  Filled 2023-09-04 – 2023-09-17 (×2): qty 45, 30d supply, fill #0
  Filled 2023-10-16: qty 45, 30d supply, fill #1

## 2023-09-08 ENCOUNTER — Other Ambulatory Visit (HOSPITAL_COMMUNITY): Payer: Self-pay

## 2023-09-17 ENCOUNTER — Other Ambulatory Visit (HOSPITAL_COMMUNITY): Payer: Self-pay

## 2023-09-18 ENCOUNTER — Other Ambulatory Visit (HOSPITAL_COMMUNITY): Payer: Self-pay

## 2023-09-18 ENCOUNTER — Other Ambulatory Visit: Payer: Self-pay

## 2023-09-18 MED ORDER — PROPRANOLOL HCL 10 MG PO TABS
10.0000 mg | ORAL_TABLET | ORAL | 0 refills | Status: DC | PRN
  Filled 2023-09-18: qty 60, 30d supply, fill #0

## 2023-09-21 ENCOUNTER — Other Ambulatory Visit (HOSPITAL_COMMUNITY): Payer: Self-pay

## 2023-10-16 ENCOUNTER — Other Ambulatory Visit (HOSPITAL_COMMUNITY): Payer: Self-pay

## 2023-11-13 ENCOUNTER — Other Ambulatory Visit: Payer: Self-pay

## 2023-11-13 ENCOUNTER — Other Ambulatory Visit (HOSPITAL_COMMUNITY): Payer: Self-pay

## 2023-11-13 MED ORDER — AMITRIPTYLINE HCL 50 MG PO TABS
50.0000 mg | ORAL_TABLET | Freq: Every day | ORAL | 1 refills | Status: AC
  Filled 2023-11-13: qty 90, 90d supply, fill #0

## 2023-11-13 MED ORDER — ESCITALOPRAM OXALATE 20 MG PO TABS
30.0000 mg | ORAL_TABLET | Freq: Every day | ORAL | 1 refills | Status: AC
  Filled 2023-11-13: qty 135, 90d supply, fill #0

## 2023-11-13 MED ORDER — PROPRANOLOL HCL 10 MG PO TABS
10.0000 mg | ORAL_TABLET | ORAL | 0 refills | Status: AC | PRN
  Filled 2023-11-13: qty 60, 30d supply, fill #0
